# Patient Record
Sex: Male | Born: 1985 | Race: Black or African American | Hispanic: No | Marital: Single | Smoking: Never smoker
Health system: Southern US, Community
[De-identification: ages and names within clinical notes are randomized; demographics above are authoritative.]

---

## 2019-10-04 ENCOUNTER — Emergency Department (HOSPITAL_COMMUNITY): Payer: Self-pay

## 2019-10-04 ENCOUNTER — Emergency Department (HOSPITAL_COMMUNITY)
Admission: EM | Admit: 2019-10-04 | Discharge: 2019-10-04 | Disposition: A | Discharge status: Home / Self Care | Payer: Self-pay | Attending: Emergency Medicine | Admitting: Emergency Medicine

## 2019-10-04 ENCOUNTER — Encounter (HOSPITAL_COMMUNITY): Payer: Self-pay

## 2019-10-04 ENCOUNTER — Other Ambulatory Visit: Payer: Self-pay

## 2019-10-04 DIAGNOSIS — Y92810 Car as the place of occurrence of the external cause: Secondary | ICD-10-CM | POA: Insufficient documentation

## 2019-10-04 DIAGNOSIS — Y999 Unspecified external cause status: Secondary | ICD-10-CM | POA: Insufficient documentation

## 2019-10-04 DIAGNOSIS — S322XXD Fracture of coccyx, subsequent encounter for fracture with routine healing: Secondary | ICD-10-CM | POA: Insufficient documentation

## 2019-10-04 DIAGNOSIS — Y9389 Activity, other specified: Secondary | ICD-10-CM | POA: Insufficient documentation

## 2019-10-04 DIAGNOSIS — M533 Sacrococcygeal disorders, not elsewhere classified: Secondary | ICD-10-CM | POA: Insufficient documentation

## 2019-10-04 DIAGNOSIS — X509XXA Other and unspecified overexertion or strenuous movements or postures, initial encounter: Secondary | ICD-10-CM | POA: Insufficient documentation

## 2019-10-04 MED ORDER — CYCLOBENZAPRINE HCL 10 MG PO TABS: 10.0000 mg | ORAL_TABLET | Freq: Two times a day (BID) | ORAL | 0 refills | Status: AC | PRN

## 2019-10-04 NOTE — ED Provider Notes (Signed)
[facility] DEPT Provider Note   CSN: [id number] Arrival date & time: 10/04/19  1206     History   Chief Complaint Chief Complaint  Patient presents with  . Tailbone Pain    HPI Russell Gordon is a 33 y.o. male.     33 year old male presents with complaint of pain in his tailbone.  Patient states that 8 months ago he was playing football in [place] when he got tackled from behind and broke his tailbone.  Patient states that today he was sitting in his car smoking a joint when he coughed and had return of pain in his tailbone, suspect she is broken it again.  No other complaints or concerns.     History reviewed. No pertinent past medical history.  There are no active problems to display for this patient.   History reviewed. No pertinent surgical history.      Home Medications    Prior to Admission medications   Medication Sig Start Date End Date Taking? Authorizing Provider  cyclobenzaprine (FLEXERIL) 10 MG tablet Take 1 tablet (10 mg total) by mouth 2 (two) times daily as needed for muscle spasms. 10/04/19   Tacy Learn, PA-C    Family History No family history on file.  Social History Social History   Tobacco Use  . Smoking status: Never Smoker  . Smokeless tobacco: Never Used  Substance Use Topics  . Alcohol use: Not on file  . Drug use: Yes    Types: Marijuana     Allergies   Patient has no allergy information on record.   Review of Systems Review of Systems  Constitutional: Negative for fever.  Gastrointestinal: Negative for abdominal pain, anal bleeding and blood in stool.  Musculoskeletal: Positive for back pain. Negative for gait problem and myalgias.  Skin: Negative for rash and wound.  Allergic/Immunologic: Negative for immunocompromised state.  Neurological: Negative for weakness and numbness.  All other systems reviewed and are negative.    Physical Exam Updated Vital Signs BP (!) 152/89 (BP  Location: Right Arm)   Pulse 85   Temp 98.5 F (36.9 C) (Oral)   Resp 18   SpO2 99%   Physical Exam Vitals signs and nursing note reviewed.  Constitutional:      General: He is not in acute distress.    Appearance: He is well-developed. He is not diaphoretic.  HENT:     Head: Normocephalic and atraumatic.  Pulmonary:     Effort: Pulmonary effort is normal.  Musculoskeletal:        General: Tenderness present. No swelling or deformity.       Back:  Skin:    General: Skin is warm and dry.     Findings: No erythema or rash.  Neurological:     Mental Status: He is alert and oriented to person, place, and time.  Psychiatric:        Behavior: Behavior normal.      ED Treatments / Results  Labs (all labs ordered are listed, but only abnormal results are displayed) Labs Reviewed - No data to display  EKG None  Radiology Dg Sacrum/coccyx  Result Date: 10/04/2019 CLINICAL DATA:  Tailbone pain after injury 8 months ago. EXAM: SACRUM AND COCCYX - 2+ VIEW COMPARISON:  None. FINDINGS: There is no evidence of fracture or other focal bone lesions. IMPRESSION: Negative. Electronically Signed   By: Marijo Conception M.D.   On: 10/04/2019 13:14    Procedures Procedures (including critical  care time)  Medications Ordered in ED Medications - No data to display   Initial Impression / Assessment and Plan / ED Course  I have reviewed the triage vital signs and the nursing notes.  Pertinent labs & imaging results that were available during my care of the patient were reviewed by me and considered in my medical decision making (see chart for details).  Clinical Course as of Oct 03 1321  Tue Oct 04, 2019  523 33 year old male with complaint of low back pain, similar to when he was diagnosed with tailbone fracture 8 months ago.  Patient states today he was smoking a joint when he coughed and had exacerbation of his pain.  On exam is tenderness across the low sacrum.  X-ray is  unremarkable.  Recommend Motrin Tylenol, given prescription for Flexeril.   [LM]    Clinical Course User Index [LM] Jeannie Fend, PA-C      Final Clinical Impressions(s) / ED Diagnoses   Final diagnoses:  Coccyx pain    ED Discharge Orders         Ordered    cyclobenzaprine (FLEXERIL) 10 MG tablet  2 times daily PRN     10/04/19 1321           Jeannie Fend, PA-C 10/04/19 1322    Milagros Loll, MD 10/05/19 [phone]

## 2019-10-04 NOTE — ED Triage Notes (Signed)
Patient reports breaking his tailbone about 8 months ago in [place]. Patient reports sitting in his car smoking and coughing that aggravated the pain. Patient states it feels worse than last time.     A/ox[facility] Ambulatory in triage.

## 2019-10-04 NOTE — Discharge Instructions (Addendum)
Your x-ray today is normal. Take Motrin and Tylenol as needed as directed for pain. Take Flexeril as needed as prescribed for pain. Follow up with PCP if pain continues.

## 2020-12-14 IMAGING — CR DG SACRUM/COCCYX 2+V
3 series · 3 of 3 positions shown · non-contrast
Comparison: None.

CLINICAL DATA: Tailbone pain after injury 8 months ago.

EXAM:
SACRUM AND COCCYX - 2+ VIEW

[t sacrum ap]
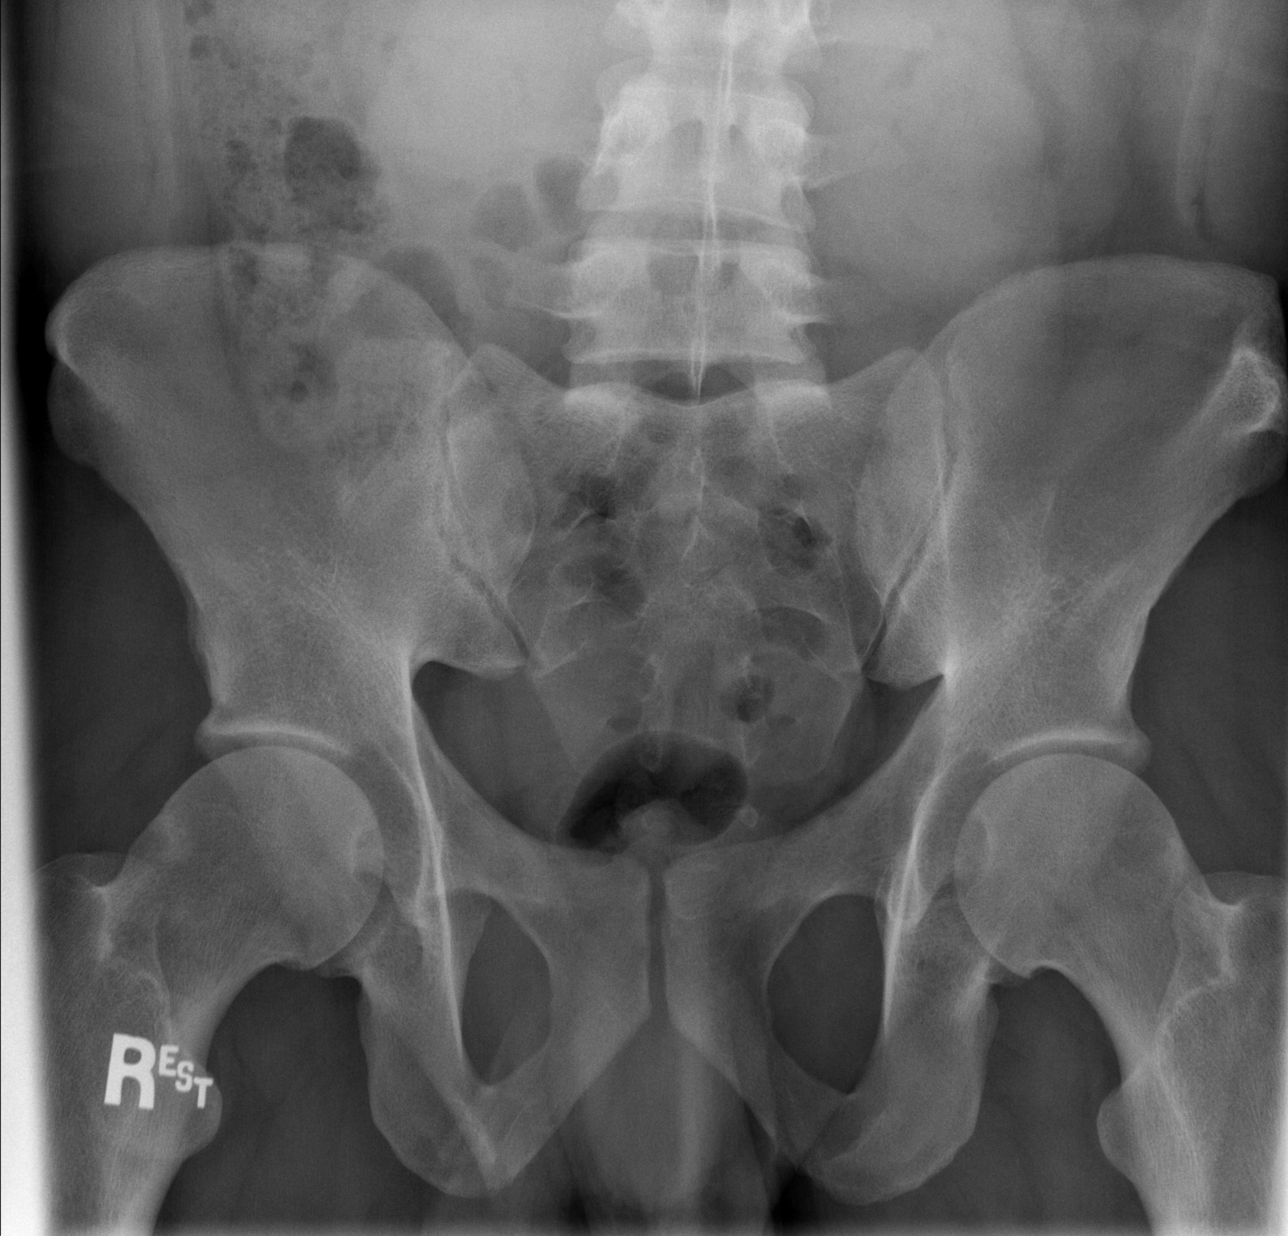

[t coccyx ap]
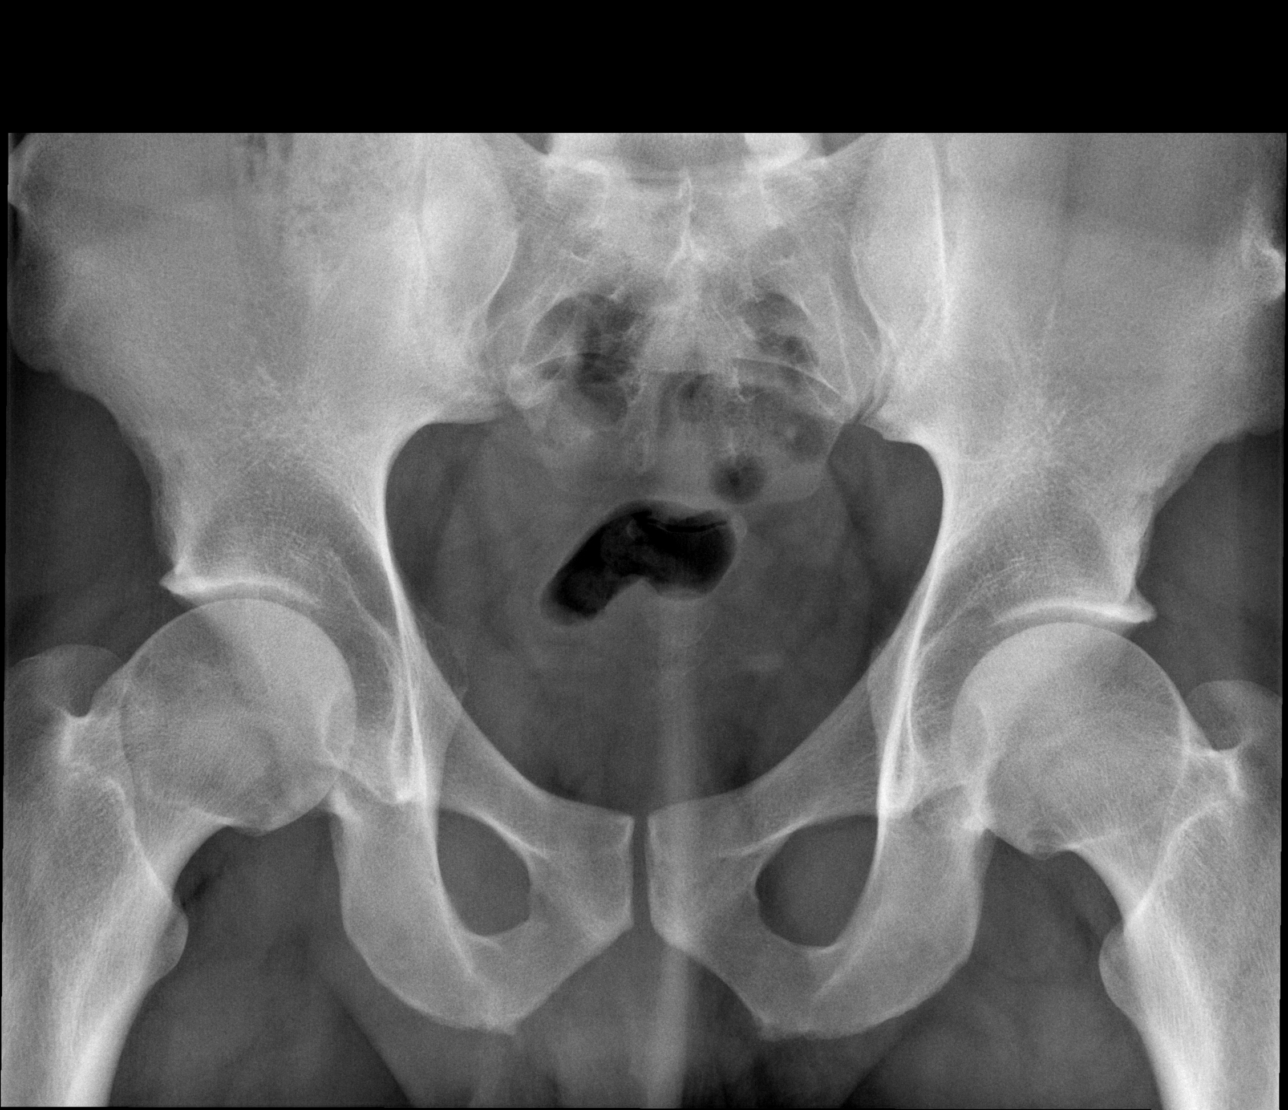

[t sacrum coccyx lat]
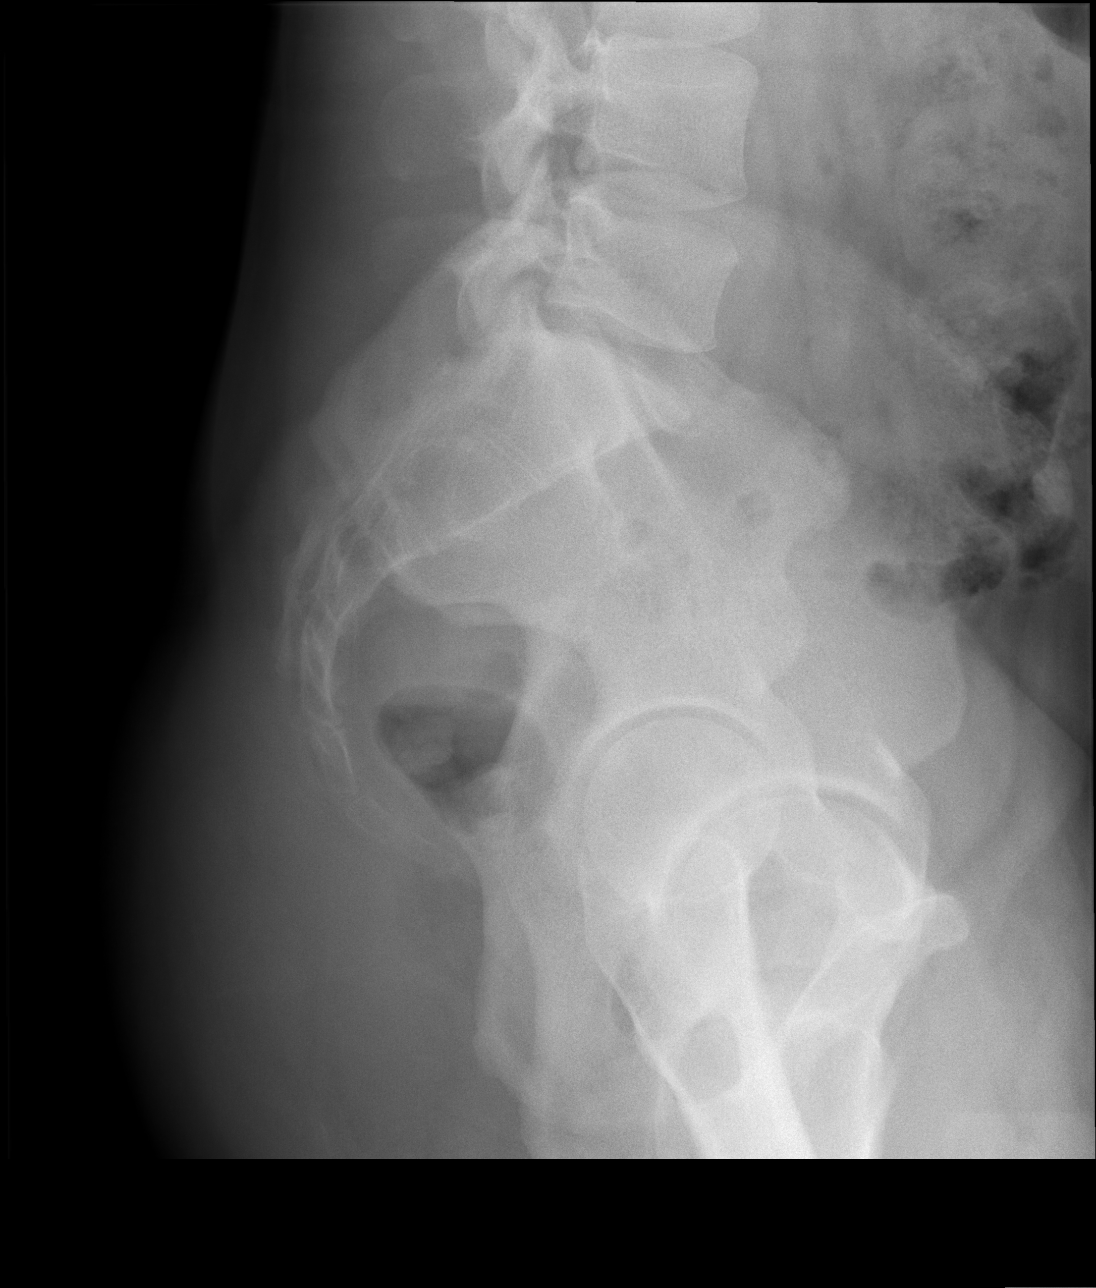

[3 of 3 positions shown; findings below may reference images not displayed]

FINDINGS: There is no evidence of fracture or other focal bone lesions.
IMPRESSION: Negative.
